# Patient Record
Sex: Male | Born: 1985 | Race: White | Hispanic: No | Marital: Single | Smoking: Never smoker
Health system: Southern US, Community
[De-identification: ages and names within clinical notes are randomized; demographics above are authoritative.]

## PROBLEM LIST (undated history)

## (undated) HISTORY — PX: WISDOM TOOTH EXTRACTION: SHX21

---
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
## 2012-03-05 ENCOUNTER — Emergency Department (HOSPITAL_COMMUNITY)
Admission: EM | Admit: 2012-03-05 | Discharge: 2012-03-05 | Disposition: A | Discharge status: Home / Self Care | Payer: Self-pay | Attending: Emergency Medicine | Admitting: Emergency Medicine

## 2012-03-05 ENCOUNTER — Emergency Department (HOSPITAL_COMMUNITY): Payer: Self-pay

## 2012-03-05 ENCOUNTER — Encounter (HOSPITAL_COMMUNITY): Payer: Self-pay | Admitting: Emergency Medicine

## 2012-03-05 DIAGNOSIS — M25579 Pain in unspecified ankle and joints of unspecified foot: Secondary | ICD-10-CM | POA: Insufficient documentation

## 2012-03-05 DIAGNOSIS — Y92009 Unspecified place in unspecified non-institutional (private) residence as the place of occurrence of the external cause: Secondary | ICD-10-CM | POA: Insufficient documentation

## 2012-03-05 DIAGNOSIS — R609 Edema, unspecified: Secondary | ICD-10-CM | POA: Insufficient documentation

## 2012-03-05 DIAGNOSIS — S82409A Unspecified fracture of shaft of unspecified fibula, initial encounter for closed fracture: Secondary | ICD-10-CM | POA: Insufficient documentation

## 2012-03-05 DIAGNOSIS — X58XXXA Exposure to other specified factors, initial encounter: Secondary | ICD-10-CM | POA: Insufficient documentation

## 2012-03-05 MED ORDER — HYDROCODONE-ACETAMINOPHEN 5-500 MG PO TABS: 1.0000 | ORAL_TABLET | Freq: Four times a day (QID) | ORAL | Status: AC | PRN

## 2012-03-05 MED ORDER — HYDROCODONE-ACETAMINOPHEN 5-500 MG PO TABS: 1.0000 | ORAL_TABLET | Freq: Four times a day (QID) | ORAL | Status: DC | PRN

## 2012-03-05 MED ORDER — IBUPROFEN 800 MG PO TABS
800.0000 mg | ORAL_TABLET | Freq: Once | ORAL | Status: AC
  Administered 2012-03-05: 800 mg via ORAL
  Filled 2012-03-05: qty 1

## 2012-03-05 MED ORDER — IBUPROFEN 800 MG PO TABS: 800.0000 mg | ORAL_TABLET | Freq: Three times a day (TID) | ORAL | Status: DC

## 2012-03-05 MED ORDER — IBUPROFEN 800 MG PO TABS: 800.0000 mg | ORAL_TABLET | Freq: Three times a day (TID) | ORAL | Status: AC

## 2012-03-05 MED ORDER — OXYCODONE-ACETAMINOPHEN 5-325 MG PO TABS
1.0000 | ORAL_TABLET | Freq: Once | ORAL | Status: AC
  Administered 2012-03-05: 1 via ORAL
  Filled 2012-03-05: qty 1

## 2012-03-05 NOTE — ED Notes (Signed)
Pt presenting to ed with c/o playing basketball and having right ankle pain with swelling.

## 2012-03-05 NOTE — ED Provider Notes (Signed)
History     CSN: [id number]  Arrival date & time 03/05/12  1519   None     No chief complaint on file.   (Consider location/radiation/quality/duration/timing/severity/associated sxs/prior treatment) Patient is a 26 y.o. male presenting with ankle pain. The history is provided by the patient. No language interpreter was used.  Ankle Pain  The incident occurred 1 to 2 hours ago. The incident occurred at home. The pain is present in the right ankle. The quality of the pain is described as aching and throbbing. The pain is at a severity of 8/10. The pain is moderate. The pain has been constant since onset. Associated symptoms include inability to bear weight. Pertinent negatives include no numbness, no loss of motion, no muscle weakness, no loss of sensation and no tingling. He reports no foreign bodies present. The symptoms are aggravated by activity. He has tried nothing for the symptoms.  Patient and mom reports playing basketball and getting a rebound and landed on his R ankle. Defomity noted.  + CMS to the ankle and foot and toes.  Will proceed with x-rays and pain meds.    No past medical history on file.  No past surgical history on file.  No family history on file.  History  Substance Use Topics  . Smoking status: Not on file  . Smokeless tobacco: Not on file  . Alcohol Use: Not on file      Review of Systems  Constitutional: Negative.   HENT: Negative.   Eyes: Negative.   Respiratory: Negative.   Cardiovascular: Negative.   Gastrointestinal: Negative.   Musculoskeletal:       R ankle pain  Neurological: Negative.  Negative for tingling and numbness.  Psychiatric/Behavioral: Negative.   All other systems reviewed and are negative.    Allergies  Review of patient's allergies indicates no known allergies.  Home Medications  No current outpatient prescriptions on file.  There were no vitals taken for this visit.  Physical Exam  Nursing note and vitals  reviewed. Constitutional: He is oriented to person, place, and time. He appears well-developed and well-nourished.  HENT:  Head: Normocephalic.  Eyes: Conjunctivae and EOM are normal. Pupils are equal, round, and reactive to light.  Neck: Normal range of motion. Neck supple.  Cardiovascular: Normal rate.   Pulmonary/Chest: Effort normal.  Abdominal: Soft.  Musculoskeletal: Normal range of motion. He exhibits edema and tenderness.       R ankle deformity tenderness and swelling  Neurological: He is alert and oriented to person, place, and time.  Skin: Skin is warm and dry.  Psychiatric: He has a normal mood and affect.    ED Course  Procedures (including critical care time)  Labs Reviewed - No data to display No results found.   No diagnosis found.    MDM  Posterior splint applied in ER for oblique fracture of the distal fibula.  Follow up with Dr. Montez Morita this week.  rx for percocet and ibuprofen. Crutches.        Remi Haggard, NP 03/06/12 1456

## 2012-03-05 NOTE — Discharge Instructions (Signed)
Henry Rojas you have fractured your distal fibula (the small bone in your leg).  We have put a posterior splint on the leg and given you crutches.  Follow up with Dr. Montez Morita the orthopedic doctor.  Call to make appointment to be seen within the next week.    Take ibuprofen for pain. Take the vicodin as needed for severe pain but do not drive with this medication.  Take these medications together.  Return to the ER for severe pain or other concerns.  Cast or Splint Care Casts and splints support injured limbs and keep bones from moving while they heal.  HOME CARE  Keep the cast or splint uncovered during the drying period.   A plaster cast can take 24 to 48 hours to dry.   A fiberglass cast will dry in less than 1 hour.   Do not rest the cast on anything harder than a pillow for 24 hours.   Do not put weight on your injured limb. Do not put pressure on the cast. Wait for your doctor's approval.   Keep the cast or splint dry.   Cover the cast or splint with a plastic bag during baths or wet weather.   If you have a cast over your chest and belly (trunk), take sponge baths until the cast is taken off.   Keep your cast or splint clean. Wash a dirty cast with a damp cloth.   Do not put any objects under your cast or splint. Do not scratch the skin under the cast with an object.   Do not take out the padding from inside your cast.   Exercise your joints near the cast as told by your doctor.   Raise (elevate) your injured limb on 1 or 2 pillows for the first 1 to 3 days.  GET HELP RIGHT AWAY IF:  Your cast or splint cracks.   Your cast or splint is too tight or too loose.   You itch badly under the cast.   Your cast gets wet or has a soft spot.   You have a bad smell coming from the cast.   You get an object stuck under the cast.   Your skin around the cast becomes red or raw.   You have new or more pain after the cast is put on.   You have fluid leaking through the cast.    You cannot move your fingers or toes.   Your fingers or toes turn colors or are cool, painful, or puffy (swollen).   You have tingling or lose feeling (numbness) around the injured area.   You have pain or pressure under the cast.   You have trouble breathing or have shortness of breath.   You have chest pain.  MAKE SURE YOU:  Understand these instructions.   Will watch your condition.   Will get help right away if you are not doing well or get worse.  Document Released: 01/24/2011 Document Revised: 09/13/2011 Document Reviewed: 01/24/2011 [facility] Patient Information 2012 [place], [place].Fibular Fracture, Ankle, Adult, Treated with or without Immobilization You have a fracture (break) of your fibula. This is the bone in your lower leg located on the outside of the leg. These fractures are easily diagnosed with x-rays. TREATMENT  You have a simple fracture of the part of the fibula which is located between the knee and ankle. This bone usually will heal without problems and can often be treated without casting or splinting. This means the fracture  will heal well during normal use and daily activities without being held in place. Sometimes a cast or splint is placed on these fractures if it is needed for comfort or if the bones are badly out of place. HOME CARE INSTRUCTIONS   Apply ice to the injury for 15 to 20 minutes, 3 to 4 times per day while awake, for 2 days. Put the ice in a plastic bag and place a thin towel between the bag of ice and your leg. This helps keep swelling down.   Use crutches as directed. Resume walking without crutches as directed by your caregiver or when comfortable doing so.   Only take over-the-counter or prescription medicines for pain, discomfort, or fever as directed by your caregiver.   Keep appointments for follow up x-rays if these are required.   If you have a removable splint or boot, do not remove the boot unless directed by your caregiver.    Warning: Do not drive a car or operate a motor vehicle until your caregiver specifically tells you it is safe to do so.  SEEK MEDICAL CARE IF:   You have continued severe pain or more swelling   The medications do not control the pain.   Your skin or nails below the injury turn blue or grey or feel cold or numb.   You develop severe pain in the leg or foot.  MAKE SURE YOU:   Understand these instructions.   Will watch your condition.   Will get help right away if you are not doing well or get worse.  Document Released: 09/24/2005 Document Revised: 09/13/2011 Document Reviewed: 04/30/2008 [facility] Patient Information 2012 [place], [place].

## 2012-03-05 NOTE — ED Notes (Signed)
Ortho tech called for splint.

## 2012-03-08 NOTE — ED Provider Notes (Signed)
Medical screening examination/treatment/procedure(s) were performed by non-physician practitioner and as supervising physician I was immediately available for consultation/collaboration.  Abrahan Fulmore T Disa Riedlinger, MD 03/08/12 2350 

## 2018-07-24 ENCOUNTER — Encounter (HOSPITAL_COMMUNITY): Payer: Self-pay | Admitting: Emergency Medicine

## 2018-07-24 ENCOUNTER — Emergency Department (HOSPITAL_COMMUNITY)
Admission: EM | Admit: 2018-07-24 | Discharge: 2018-07-24 | Disposition: A | Discharge status: Home / Self Care | Payer: Self-pay | Attending: Emergency Medicine | Admitting: Emergency Medicine

## 2018-07-24 ENCOUNTER — Emergency Department (HOSPITAL_COMMUNITY): Payer: Self-pay

## 2018-07-24 ENCOUNTER — Other Ambulatory Visit: Payer: Self-pay

## 2018-07-24 DIAGNOSIS — Z23 Encounter for immunization: Secondary | ICD-10-CM | POA: Insufficient documentation

## 2018-07-24 DIAGNOSIS — Y9389 Activity, other specified: Secondary | ICD-10-CM | POA: Insufficient documentation

## 2018-07-24 DIAGNOSIS — Y999 Unspecified external cause status: Secondary | ICD-10-CM | POA: Insufficient documentation

## 2018-07-24 DIAGNOSIS — W228XXA Striking against or struck by other objects, initial encounter: Secondary | ICD-10-CM | POA: Insufficient documentation

## 2018-07-24 DIAGNOSIS — S62317A Displaced fracture of base of fifth metacarpal bone. left hand, initial encounter for closed fracture: Secondary | ICD-10-CM | POA: Insufficient documentation

## 2018-07-24 DIAGNOSIS — Y929 Unspecified place or not applicable: Secondary | ICD-10-CM | POA: Insufficient documentation

## 2018-07-24 MED ORDER — KETOROLAC TROMETHAMINE 15 MG/ML IJ SOLN
15.0000 mg | Freq: Once | INTRAMUSCULAR | Status: DC
  Filled 2018-07-24: qty 1

## 2018-07-24 MED ORDER — OXYCODONE-ACETAMINOPHEN 5-325 MG PO TABS: 1.0000 | ORAL_TABLET | Freq: Three times a day (TID) | ORAL | 0 refills | Status: DC | PRN

## 2018-07-24 MED ORDER — BUPIVACAINE HCL 0.5 % IJ SOLN: 15.0000 mL | Freq: Once | INTRAMUSCULAR | Status: DC

## 2018-07-24 MED ORDER — TETANUS-DIPHTH-ACELL PERTUSSIS 5-2.5-18.5 LF-MCG/0.5 IM SUSP
0.5000 mL | Freq: Once | INTRAMUSCULAR | Status: AC
  Administered 2018-07-24: 0.5 mL via INTRAMUSCULAR
  Filled 2018-07-24: qty 0.5

## 2018-07-24 MED ORDER — BUPIVACAINE HCL (PF) 0.5 % IJ SOLN
15.0000 mL | Freq: Once | INTRAMUSCULAR | Status: AC
  Administered 2018-07-24: 15 mL
  Filled 2018-07-24: qty 30

## 2018-07-24 MED ORDER — LIDOCAINE HCL 2 % IJ SOLN
10.0000 mL | Freq: Once | INTRAMUSCULAR | Status: AC
  Administered 2018-07-24: 200 mg via INTRADERMAL
  Filled 2018-07-24: qty 20

## 2018-07-24 MED ORDER — OXYCODONE-ACETAMINOPHEN 5-325 MG PO TABS
1.0000 | ORAL_TABLET | Freq: Once | ORAL | Status: AC
  Administered 2018-07-24: 1 via ORAL
  Filled 2018-07-24: qty 1

## 2018-07-24 NOTE — Discharge Instructions (Addendum)
Please follow-up with Dr. Melvyn Novas in the orthopedic office.  You may take 500 mg of Tylenol every 6 hours for your pain.  Do not take more than 4000 g of Tylenol in a day.  Prescription given for Percocet. Take medication as directed and do not operate machinery, drive a car, or work while taking this medication as it can make you drowsy.  You may take this medication every 8 hours if you have breakthrough pain.  If you become constipated from taking Percocet you may take MiraLAX.  Return to the emergency department for any severe swelling, discoloration of your fingers, numbness to your fingers.
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
## 2018-07-29 ENCOUNTER — Encounter (INDEPENDENT_AMBULATORY_CARE_PROVIDER_SITE_OTHER): Payer: Self-pay | Admitting: Orthopaedic Surgery

## 2018-07-29 ENCOUNTER — Ambulatory Visit (INDEPENDENT_AMBULATORY_CARE_PROVIDER_SITE_OTHER): Payer: Self-pay | Admitting: Orthopaedic Surgery

## 2018-07-29 DIAGNOSIS — S62317A Displaced fracture of base of fifth metacarpal bone. left hand, initial encounter for closed fracture: Secondary | ICD-10-CM | POA: Insufficient documentation

## 2018-07-29 MED ORDER — OXYCODONE-ACETAMINOPHEN 5-325 MG PO TABS: ORAL_TABLET | ORAL | 0 refills | Status: DC

## 2018-07-29 NOTE — Addendum Note (Signed)
Addended byPrescott Parma on: 07/29/2018 04:40 PM   Modules accepted: Orders
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
## 2018-07-30 ENCOUNTER — Encounter (HOSPITAL_BASED_OUTPATIENT_CLINIC_OR_DEPARTMENT_OTHER): Payer: Self-pay | Admitting: *Deleted

## 2018-07-30 ENCOUNTER — Other Ambulatory Visit: Payer: Self-pay

## 2018-08-01 ENCOUNTER — Encounter (HOSPITAL_BASED_OUTPATIENT_CLINIC_OR_DEPARTMENT_OTHER)
Admission: RE | Disposition: A | Discharge status: Home / Self Care | Payer: Self-pay | Source: Ambulatory Visit | Attending: Orthopaedic Surgery

## 2018-08-01 ENCOUNTER — Ambulatory Visit (HOSPITAL_BASED_OUTPATIENT_CLINIC_OR_DEPARTMENT_OTHER): Payer: Self-pay | Admitting: Anesthesiology

## 2018-08-01 ENCOUNTER — Ambulatory Visit (HOSPITAL_COMMUNITY): Payer: Self-pay

## 2018-08-01 ENCOUNTER — Encounter (HOSPITAL_BASED_OUTPATIENT_CLINIC_OR_DEPARTMENT_OTHER): Payer: Self-pay

## 2018-08-01 ENCOUNTER — Ambulatory Visit (HOSPITAL_BASED_OUTPATIENT_CLINIC_OR_DEPARTMENT_OTHER)
Admission: RE | Admit: 2018-08-01 | Discharge: 2018-08-01 | Disposition: A | Discharge status: Home / Self Care | Payer: Self-pay | Source: Ambulatory Visit | Attending: Orthopaedic Surgery | Admitting: Orthopaedic Surgery

## 2018-08-01 ENCOUNTER — Other Ambulatory Visit: Payer: Self-pay

## 2018-08-01 DIAGNOSIS — S62327A Displaced fracture of shaft of fifth metacarpal bone, left hand, initial encounter for closed fracture: Secondary | ICD-10-CM | POA: Insufficient documentation

## 2018-08-01 DIAGNOSIS — W2209XA Striking against other stationary object, initial encounter: Secondary | ICD-10-CM | POA: Insufficient documentation

## 2018-08-01 DIAGNOSIS — Y92008 Other place in unspecified non-institutional (private) residence as the place of occurrence of the external cause: Secondary | ICD-10-CM | POA: Insufficient documentation

## 2018-08-01 DIAGNOSIS — S62317A Displaced fracture of base of fifth metacarpal bone. left hand, initial encounter for closed fracture: Secondary | ICD-10-CM | POA: Diagnosis present

## 2018-08-01 DIAGNOSIS — Z419 Encounter for procedure for purposes other than remedying health state, unspecified: Secondary | ICD-10-CM

## 2018-08-01 HISTORY — PX: CLOSED REDUCTION METACARPAL WITH PERCUTANEOUS PINNING: SHX5613

## 2018-08-01 SURGERY — CLOSED REDUCTION, FRACTURE, METACARPAL BONE, WITH PERCUTANEOUS PINNING
Anesthesia: General | Site: Hand | Laterality: Left

## 2018-08-01 MED ORDER — PROPOFOL 10 MG/ML IV BOLUS
INTRAVENOUS | Status: DC | PRN
  Administered 2018-08-01: 50 mg via INTRAVENOUS
  Administered 2018-08-01: 200 mg via INTRAVENOUS

## 2018-08-01 MED ORDER — MIDAZOLAM HCL 2 MG/2ML IJ SOLN
1.0000 mg | INTRAMUSCULAR | Status: DC | PRN
  Administered 2018-08-01: 2 mg via INTRAVENOUS

## 2018-08-01 MED ORDER — MIDAZOLAM HCL 2 MG/2ML IJ SOLN
INTRAMUSCULAR | Status: AC
  Filled 2018-08-01: qty 2

## 2018-08-01 MED ORDER — OXYCODONE HCL 5 MG PO TABS
5.0000 mg | ORAL_TABLET | Freq: Once | ORAL | Status: AC | PRN
  Administered 2018-08-01: 5 mg via ORAL

## 2018-08-01 MED ORDER — OXYCODONE HCL 5 MG/5ML PO SOLN: 5.0000 mg | Freq: Once | ORAL | Status: AC | PRN

## 2018-08-01 MED ORDER — ACETAMINOPHEN 325 MG PO TABS: 325.0000 mg | ORAL_TABLET | ORAL | Status: DC | PRN

## 2018-08-01 MED ORDER — FENTANYL CITRATE (PF) 100 MCG/2ML IJ SOLN
25.0000 ug | INTRAMUSCULAR | Status: DC | PRN
  Administered 2018-08-01 (×2): 50 ug via INTRAVENOUS

## 2018-08-01 MED ORDER — DEXAMETHASONE SODIUM PHOSPHATE 4 MG/ML IJ SOLN
INTRAMUSCULAR | Status: DC | PRN
  Administered 2018-08-01: 10 mg via INTRAVENOUS

## 2018-08-01 MED ORDER — CHLORHEXIDINE GLUCONATE 4 % EX LIQD: 60.0000 mL | Freq: Once | CUTANEOUS | Status: DC

## 2018-08-01 MED ORDER — SCOPOLAMINE 1 MG/3DAYS TD PT72: 1.0000 | MEDICATED_PATCH | Freq: Once | TRANSDERMAL | Status: DC | PRN

## 2018-08-01 MED ORDER — CEFAZOLIN SODIUM-DEXTROSE 2-4 GM/100ML-% IV SOLN
INTRAVENOUS | Status: AC
  Filled 2018-08-01: qty 100

## 2018-08-01 MED ORDER — FENTANYL CITRATE (PF) 100 MCG/2ML IJ SOLN
50.0000 ug | INTRAMUSCULAR | Status: AC | PRN
  Administered 2018-08-01: 50 ug via INTRAVENOUS
  Administered 2018-08-01: 100 ug via INTRAVENOUS
  Administered 2018-08-01: 50 ug via INTRAVENOUS

## 2018-08-01 MED ORDER — OXYCODONE HCL 5 MG PO TABS
ORAL_TABLET | ORAL | Status: AC
  Filled 2018-08-01: qty 1

## 2018-08-01 MED ORDER — FENTANYL CITRATE (PF) 100 MCG/2ML IJ SOLN
INTRAMUSCULAR | Status: AC
  Filled 2018-08-01: qty 2

## 2018-08-01 MED ORDER — ACETAMINOPHEN 160 MG/5ML PO SOLN: 325.0000 mg | ORAL | Status: DC | PRN

## 2018-08-01 MED ORDER — ONDANSETRON HCL 4 MG/2ML IJ SOLN
INTRAMUSCULAR | Status: DC | PRN
  Administered 2018-08-01: 4 mg via INTRAVENOUS

## 2018-08-01 MED ORDER — ONDANSETRON HCL 4 MG/2ML IJ SOLN
INTRAMUSCULAR | Status: AC
  Filled 2018-08-01: qty 2

## 2018-08-01 MED ORDER — BUPIVACAINE HCL (PF) 0.25 % IJ SOLN
INTRAMUSCULAR | Status: DC | PRN
  Administered 2018-08-01: 10 mL

## 2018-08-01 MED ORDER — LIDOCAINE 2% (20 MG/ML) 5 ML SYRINGE
INTRAMUSCULAR | Status: AC
  Filled 2018-08-01: qty 5

## 2018-08-01 MED ORDER — CEFAZOLIN SODIUM-DEXTROSE 2-4 GM/100ML-% IV SOLN
2.0000 g | INTRAVENOUS | Status: AC
  Administered 2018-08-01: 2 g via INTRAVENOUS

## 2018-08-01 MED ORDER — LIDOCAINE HCL (CARDIAC) PF 100 MG/5ML IV SOSY
PREFILLED_SYRINGE | INTRAVENOUS | Status: DC | PRN
  Administered 2018-08-01: 100 mg via INTRAVENOUS

## 2018-08-01 MED ORDER — DEXAMETHASONE SODIUM PHOSPHATE 10 MG/ML IJ SOLN
INTRAMUSCULAR | Status: AC
  Filled 2018-08-01: qty 1

## 2018-08-01 MED ORDER — PROPOFOL 500 MG/50ML IV EMUL
INTRAVENOUS | Status: AC
  Filled 2018-08-01: qty 50

## 2018-08-01 MED ORDER — ONDANSETRON HCL 4 MG/2ML IJ SOLN: 4.0000 mg | Freq: Once | INTRAMUSCULAR | Status: DC | PRN

## 2018-08-01 MED ORDER — OXYCODONE-ACETAMINOPHEN 5-325 MG PO TABS: 1.0000 | ORAL_TABLET | Freq: Three times a day (TID) | ORAL | 0 refills | Status: DC | PRN

## 2018-08-01 MED ORDER — LACTATED RINGERS IV SOLN
INTRAVENOUS | Status: DC
  Administered 2018-08-01: 13:00:00 via INTRAVENOUS

## 2018-08-01 MED ORDER — LACTATED RINGERS IV SOLN
INTRAVENOUS | Status: DC
  Administered 2018-08-01: 12:00:00 via INTRAVENOUS

## 2018-08-01 MED ORDER — ONDANSETRON HCL 4 MG PO TABS: 4.0000 mg | ORAL_TABLET | Freq: Three times a day (TID) | ORAL | 0 refills | Status: DC | PRN

## 2018-08-01 MED ORDER — MEPERIDINE HCL 25 MG/ML IJ SOLN: 6.2500 mg | INTRAMUSCULAR | Status: DC | PRN

## 2018-08-01 SURGICAL SUPPLY — 60 items
BANDAGE ACE 3X5.8 VEL STRL LF (GAUZE/BANDAGES/DRESSINGS) ×4 IMPLANT
BLADE MINI RND TIP GREEN BEAV (BLADE) IMPLANT
BLADE SURG 15 STRL LF DISP TIS (BLADE) ×2 IMPLANT
BLADE SURG 15 STRL SS (BLADE) ×4
BNDG CMPR 9X4 STRL LF SNTH (GAUZE/BANDAGES/DRESSINGS) ×2
BNDG ESMARK 4X9 LF (GAUZE/BANDAGES/DRESSINGS) ×4 IMPLANT
BNDG PLASTER X FAST 3X3 WHT LF (CAST SUPPLIES) ×4 IMPLANT
BNDG PLSTR 9X3 FST ST WHT (CAST SUPPLIES) ×1
BRUSH SCRUB EZ PLAIN DRY (MISCELLANEOUS) ×4 IMPLANT
CANISTER SUCTION 1200CC (MISCELLANEOUS) ×4 IMPLANT
CORD BIPOLAR FORCEPS 12FT (ELECTRODE) IMPLANT
COVER BACK TABLE 60X90IN (DRAPES) ×4 IMPLANT
COVER MAYO STAND STRL (DRAPES) ×4 IMPLANT
COVER WAND RF STERILE (DRAPES) IMPLANT
CUFF TOURNIQUET SINGLE 18IN (TOURNIQUET CUFF) ×4 IMPLANT
DECANTER SPIKE VIAL GLASS SM (MISCELLANEOUS) IMPLANT
DRAPE C-ARM 42X72 X-RAY (DRAPES) ×4 IMPLANT
DRAPE EXTREMITY T 121X128X90 (DRAPE) ×4 IMPLANT
DRAPE OEC MINIVIEW 54X84 (DRAPES) ×4 IMPLANT
DRAPE SURG 17X23 STRL (DRAPES) ×4 IMPLANT
GAUZE SPONGE 4X4 12PLY STRL (GAUZE/BANDAGES/DRESSINGS) ×4 IMPLANT
GAUZE XEROFORM 1X8 LF (GAUZE/BANDAGES/DRESSINGS) ×4 IMPLANT
GLOVE BIOGEL PI IND STRL 7.0 (GLOVE) IMPLANT
GLOVE BIOGEL PI IND STRL 8 (GLOVE) ×4 IMPLANT
GLOVE BIOGEL PI INDICATOR 7.0 (GLOVE)
GLOVE BIOGEL PI INDICATOR 8 (GLOVE) ×4
GLOVE ECLIPSE 7.0 STRL STRAW (GLOVE) IMPLANT
GLOVE SKINSENSE NS SZ7.5 (GLOVE) ×2
GLOVE SKINSENSE STRL SZ7.5 (GLOVE) ×2 IMPLANT
GLOVE SURG SS PI 7.5 STRL IVOR (GLOVE) ×4 IMPLANT
GLOVE SURG SYN 7.5  E (GLOVE) ×2
GLOVE SURG SYN 7.5 E (GLOVE) ×2 IMPLANT
GOWN STRL REIN XL XLG (GOWN DISPOSABLE) ×8 IMPLANT
GOWN STRL REUS W/ TWL LRG LVL3 (GOWN DISPOSABLE) IMPLANT
GOWN STRL REUS W/ TWL XL LVL3 (GOWN DISPOSABLE) IMPLANT
GOWN STRL REUS W/TWL LRG LVL3 (GOWN DISPOSABLE)
GOWN STRL REUS W/TWL XL LVL3 (GOWN DISPOSABLE)
GUIDEWIRE ORTH 6X062XTROC NS (WIRE) ×2 IMPLANT
K-WIRE .035X4 (WIRE) ×8 IMPLANT
K-WIRE .062 (WIRE) ×3
NEEDLE HYPO 22GX1.5 SAFETY (NEEDLE) IMPLANT
NS IRRIG 1000ML POUR BTL (IV SOLUTION) ×4 IMPLANT
PACK BASIN DAY SURGERY FS (CUSTOM PROCEDURE TRAY) ×4 IMPLANT
PAD CAST 3X4 CTTN HI CHSV (CAST SUPPLIES) ×2 IMPLANT
PADDING CAST COTTON 3X4 STRL (CAST SUPPLIES) ×4
RUBBERBAND STERILE (MISCELLANEOUS) IMPLANT
SLEEVE SCD COMPRESS KNEE MED (MISCELLANEOUS) IMPLANT
SPLINT FIBERGLASS 3X35 (CAST SUPPLIES) ×4 IMPLANT
STOCKINETTE 4X48 STRL (DRAPES) ×4 IMPLANT
SUCTION FRAZIER HANDLE 10FR (MISCELLANEOUS)
SUCTION TUBE FRAZIER 10FR DISP (MISCELLANEOUS) IMPLANT
SUT ETHILON 4 0 PS 2 18 (SUTURE) IMPLANT
SYR BULB 3OZ (MISCELLANEOUS) ×4 IMPLANT
SYR CONTROL 10ML LL (SYRINGE) IMPLANT
TOWEL GREEN STERILE FF (TOWEL DISPOSABLE) ×4 IMPLANT
TOWEL OR NON WOVEN STRL DISP B (DISPOSABLE) ×4 IMPLANT
TRAY DSU PREP LF (CUSTOM PROCEDURE TRAY) ×4 IMPLANT
TUBE CONNECTING 20'X1/4 (TUBING)
TUBE CONNECTING 20X1/4 (TUBING) IMPLANT
UNDERPAD 30X30 (UNDERPADS AND DIAPERS) ×4 IMPLANT

## 2018-08-01 NOTE — Discharge Instructions (Signed)
Postoperative instructions: ° °Weightbearing instructions: non weight bearing ° °Dressing instructions: Keep your dressing and/or splint clean and dry at all times.  It will be removed at your first post-operative appointment.  Your stitches and/or staples will be removed at this visit. ° °Incision instructions:  Do not soak your incision for 3 weeks after surgery.  If the incision gets wet, pat dry and do not scrub the incision. ° °Pain control:  You have been given a prescription to be taken as directed for post-operative pain control.  In addition, elevate the operative extremity above the heart at all times to prevent swelling and throbbing pain. ° °Take over-the-counter Colace, 100mg by mouth twice a day while taking narcotic pain medications to help prevent constipation. ° °Follow up appointments: °1) 12-14 days for suture removal and wound check. °2) Dr. Xu as scheduled. ° ° ------------------------------------------------------------------------------------------------------------- ° °After Surgery Pain Control: ° °After your surgery, post-surgical discomfort or pain is likely. This discomfort can last several days to a few weeks. At certain times of the day your discomfort may be more intense.  °Did you receive a nerve block?  °A nerve block can provide pain relief for one hour to two days after your surgery. As long as the nerve block is working, you will experience little or no sensation in the area the surgeon operated on.  °As the nerve block wears off, you will begin to experience pain or discomfort. It is very important that you begin taking your prescribed pain medication before the nerve block fully wears off. Treating your pain at the first sign of the block wearing off will ensure your pain is better controlled and more tolerable when full-sensation returns. Do not wait until the pain is intolerable, as the medicine will be less effective. It is better to treat pain in advance than to try and  catch up.  °General Anesthesia:  °If you did not receive a nerve block during your surgery, you will need to start taking your pain medication shortly after your surgery and should continue to do so as prescribed by your surgeon.  °Pain Medication:  °Most commonly we prescribe Vicodin and Percocet for post-operative pain. Both of these medications contain a combination of acetaminophen (Tylenol®) and a narcotic to help control pain.  °· It takes between 30 and 45 minutes before pain medication starts to work. It is important to take your medication before your pain level gets too intense.  °· Nausea is a common side effect of many pain medications. You will want to eat something before taking your pain medicine to help prevent nausea.  °· If you are taking a prescription pain medication that contains acetaminophen, we recommend that you do not take additional over the counter acetaminophen (Tylenol®).  °Other pain relieving options:  °· Using a cold pack to ice the affected area a few times a day (15 to 20 minutes at a time) can help to relieve pain, reduce swelling and bruising.  °· Elevation of the affected area can also help to reduce pain and swelling. ° ° ° ° ° ° °Post Anesthesia Home Care Instructions ° °Activity: °Get plenty of rest for the remainder of the day. A responsible individual must stay with you for 24 hours following the procedure.  °For the next 24 hours, DO NOT: °-Drive a car °-Operate machinery °-Drink alcoholic beverages °-Take any medication unless instructed by your physician °-Make any legal decisions or sign important papers. ° °Meals: °Start with liquid foods such   as gelatin or soup. Progress to regular foods as tolerated. Avoid greasy, spicy, heavy foods. If nausea and/or vomiting occur, drink only clear liquids until the nausea and/or vomiting subsides. Call your physician if vomiting continues. ° °Special Instructions/Symptoms: °Your throat may feel dry or sore from the anesthesia or  the breathing tube placed in your throat during surgery. If this causes discomfort, gargle with warm salt water. The discomfort should disappear within 24 hours. ° °If you had a scopolamine patch placed behind your ear for the management of post- operative nausea and/or vomiting: ° °1. The medication in the patch is effective for 72 hours, after which it should be removed.  Wrap patch in a tissue and discard in the trash. Wash hands thoroughly with soap and water. °2. You may remove the patch earlier than 72 hours if you experience unpleasant side effects which may include dry mouth, dizziness or visual disturbances. °3. Avoid touching the patch. Wash your hands with soap and water after contact with the patch. °   ° °

## 2018-08-01 NOTE — Anesthesia Preprocedure Evaluation (Addendum)

## 2018-08-01 NOTE — H&P (Signed)
    PREOPERATIVE H&P  Chief Complaint: left 5th metacarpal fracture  HPI: Henry Rojas is a 32 y.o. male who presents for surgical treatment of left 5th metacarpal fracture.  He denies any changes in medical history.  History reviewed. No pertinent past medical history. Past Surgical History:  Procedure Laterality Date  . WISDOM TOOTH EXTRACTION     Social History   Socioeconomic History  . Marital status: Single    Spouse name: Not on file  . Number of children: Not on file  . Years of education: Not on file  . Highest education level: Not on file  Occupational History  . Not on file  Social Needs  . Financial resource strain: Not on file  . Food insecurity:    Worry: Not on file    Inability: Not on file  . Transportation needs:    Medical: Not on file    Non-medical: Not on file  Tobacco Use  . Smoking status: Never Smoker  . Smokeless tobacco: Never Used  Substance and Sexual Activity  . Alcohol use: Yes    Comment: socially  . Drug use: Yes    Types: Marijuana, Cocaine, Other-see comments    Comment:  marijuana 07/30/2018, Cocaine - 1 month ago  . Sexual activity: Yes  Lifestyle  . Physical activity:    Days per week: Not on file    Minutes per session: Not on file  . Stress: Not on file  Relationships  . Social connections:    Talks on phone: Not on file    Gets together: Not on file    Attends religious service: Not on file    Active member of club or organization: Not on file    Attends meetings of clubs or organizations: Not on file    Relationship status: Not on file  Other Topics Concern  . Not on file  Social History Narrative  . Not on file   Family History  Problem Relation Age of Onset  . Healthy Mother   . Diabetes Father   . Kidney failure Father    No Known Allergies Prior to Admission medications   Medication Sig Start Date End Date Taking? Authorizing Provider  oxyCODONE-acetaminophen (PERCOCET/ROXICET) 5-325 MG tablet Take 1  tablet by mouth every 8 (eight) hours as needed for severe pain. 07/24/18  Yes Couture, Cortni S, PA-C  oxyCODONE-acetaminophen (PERCOCET/ROXICET) 5-325 MG tablet 1 po bid prn pain 07/29/18   Tarry Kos, MD     Positive ROS: All other systems have been reviewed and were otherwise negative with the exception of those mentioned in the HPI and as above.  Physical Exam: General: Alert, no acute distress Cardiovascular: No pedal edema Respiratory: No cyanosis, no use of accessory musculature GI: abdomen soft Skin: No lesions in the area of chief complaint Neurologic: Sensation intact distally Psychiatric: Patient is competent for consent with normal mood and affect Lymphatic: no lymphedema  MUSCULOSKELETAL: exam stable  Assessment: left 5th metacarpal fracture  Plan: Plan for Procedure(s): OPEN REDUCTION INTERNAL FIXATION (ORIF)  VS. CLOSED REDUCTION PERCUTANEOUS PINNING LEFT 5TH METACARPAL FRACTURE  The risks benefits and alternatives were discussed with the patient including but not limited to the risks of nonoperative treatment, versus surgical intervention including infection, bleeding, nerve injury,  blood clots, cardiopulmonary complications, morbidity, mortality, among others, and they were willing to proceed.   Glee Arvin, MD   08/01/2018 11:52 AM

## 2018-08-01 NOTE — Transfer of Care (Signed)
Immediate Anesthesia Transfer of Care Note  Patient: Henry Rojas  Procedure(s) Performed: OPEN REDUCTION INTERNAL FIXATION (ORIF)  VS. CLOSED REDUCTION PERCUTANEOUS PINNING LEFT 5TH METACARPAL FRACTURE (Left )  Patient Location: PACU  Anesthesia Type:General  Level of Consciousness: awake and oriented  Airway & Oxygen Therapy: Patient Spontanous Breathing  Post-op Assessment: Report given to RN and Post -op Vital signs reviewed and stable  Post vital signs: Reviewed and stable  Last Vitals:  Vitals Value Taken Time  BP    Temp    Pulse 96 08/01/2018  1:56 PM  Resp 20 08/01/2018  1:56 PM  SpO2 100 % 08/01/2018  1:56 PM  Vitals shown include unvalidated device data.  Last Pain:  Vitals:   08/01/18 1126  PainSc: 4       Patients Stated Pain Goal: 2 (08/01/18 1126)  Complications: No apparent anesthesia complications

## 2018-08-01 NOTE — Anesthesia Procedure Notes (Signed)
Procedure Name: LMA Insertion Performed by: Gar Gibbon, CRNA Pre-anesthesia Checklist: Patient identified, Emergency Drugs available, Suction available and Patient being monitored Patient Re-evaluated:Patient Re-evaluated prior to induction Oxygen Delivery Method: Circle system utilized Preoxygenation: Pre-oxygenation with 100% oxygen Induction Type: IV induction Ventilation: Mask ventilation without difficulty LMA: LMA inserted LMA Size: 4.0 Number of attempts: 1 Airway Equipment and Method: Bite block Placement Confirmation: positive ETCO2 Tube secured with: Tape Dental Injury: Teeth and Oropharynx as per pre-operative assessment

## 2018-08-01 NOTE — Op Note (Signed)
   Date of Surgery: 08/01/2018  INDICATIONS: Henry Rojas is a 32 y.o.-year-old male with a left displaced fifth metacarpal fracture;  The patient did consent to the procedure after discussion of the risks and benefits.  PREOPERATIVE DIAGNOSIS: Displaced left fifth metacarpal shaft fracture  POSTOPERATIVE DIAGNOSIS: Same.  PROCEDURE: Closed reduction and percutaneous pinning of 5th metacarpal fracture  SURGEON: N. Glee Arvin, M.D.  ASSIST: none  ANESTHESIA:  general  IV FLUIDS AND URINE: See anesthesia.  ESTIMATED BLOOD LOSS: minimal mL.  IMPLANTS: 0.062 K wire, 0.035 K wire  DRAINS: none  COMPLICATIONS: None.  DESCRIPTION OF PROCEDURE: The patient was brought to the operating room and placed supine on the operating table.  The patient had been signed prior to the procedure and this was documented. The patient had the anesthesia placed by the anesthesiologist.  A time-out was performed to confirm that this was the correct patient, site, side and location. The patient did receive antibiotics prior to the incision and was re-dosed during the procedure as needed at indicated intervals.  A tourniquet was placed.  The patient had the operative extremity prepped and draped in the standard surgical fashion.    Closed reduction of the metacarpal fracture was performed with palpable crepitus of the fracture site.  Reduction was confirmed under fluoroscopy.  While reduced a 0.062 K wire was advanced in a retrograde direction down the canal of the metacarpal starting in the metacarpal head.  The placement was confirmed with fluoro on orthogonal views.  I then placed a 0.035 K wire from the 5th metacarpal into the 4th metacarpal for rotational stability.  This was also confirmed under fluoro.  Final xrays were taken.  The small finger was in correct alignment and rotation.  The pins were bent and cut short.  Sterile dressings and a ulnar gutter splint was applied.  Patient tolerated the procedure well  and had no immediate complications.  POSTOPERATIVE PLAN: discharge home and follow up in 2 weeks for splint removal and xrays  N. Glee Arvin, MD [facility] [phone] 1:49 PM

## 2018-08-05 ENCOUNTER — Encounter (HOSPITAL_BASED_OUTPATIENT_CLINIC_OR_DEPARTMENT_OTHER): Payer: Self-pay | Admitting: Orthopaedic Surgery

## 2018-08-05 NOTE — Anesthesia Postprocedure Evaluation (Signed)
Anesthesia Post Note  Patient: Henry Rojas  Procedure(s) Performed: CLOSED REDUCTION PERCUTANEOUS PINNING LEFT 5TH METACARPAL FRACTURE (Left Hand)     Patient location during evaluation: PACU Anesthesia Type: General Level of consciousness: awake and alert Pain management: pain level controlled Vital Signs Assessment: post-procedure vital signs reviewed and stable Respiratory status: spontaneous breathing, nonlabored ventilation, respiratory function stable and patient connected to nasal cannula oxygen Cardiovascular status: blood pressure returned to baseline and stable Postop Assessment: no apparent nausea or vomiting Anesthetic complications: no    Last Vitals:  Vitals:   08/01/18 1430 08/01/18 1443  BP: (!) 140/97 (!) 135/99  Pulse: 82 89  Resp: 16 16  Temp:  37.2 C  SpO2: 98% 98%    Last Pain:  Vitals:   08/01/18 1443  PainSc: 5                  Henry Rojas

## 2018-08-08 ENCOUNTER — Encounter (HOSPITAL_BASED_OUTPATIENT_CLINIC_OR_DEPARTMENT_OTHER): Payer: Self-pay | Admitting: Orthopaedic Surgery

## 2018-08-12 ENCOUNTER — Ambulatory Visit (INDEPENDENT_AMBULATORY_CARE_PROVIDER_SITE_OTHER): Payer: Self-pay

## 2018-08-12 ENCOUNTER — Ambulatory Visit (INDEPENDENT_AMBULATORY_CARE_PROVIDER_SITE_OTHER): Payer: Self-pay | Admitting: Orthopaedic Surgery

## 2018-08-12 DIAGNOSIS — S62317A Displaced fracture of base of fifth metacarpal bone. left hand, initial encounter for closed fracture: Secondary | ICD-10-CM

## 2018-08-12 MED ORDER — OXYCODONE-ACETAMINOPHEN 5-325 MG PO TABS: 1.0000 | ORAL_TABLET | Freq: Every evening | ORAL | 0 refills | Status: DC | PRN

## 2018-08-12 NOTE — Progress Notes (Signed)
   Post-Op Visit Note   Patient: Henry Rojas           Date of Birth: July 31, 1986           MRN: [id number] Visit Date: 08/12/2018 PCP: Patient, No Pcp Per   Assessment & Plan:  Chief Complaint:  Chief Complaint  Patient presents with  . Left Hand - Routine Post Op   Visit Diagnoses:  1. Closed displaced fracture of base of fifth metacarpal bone of left hand, initial encounter     Plan: Jeannett Senior is two-week status post percutaneous pinning of a displaced fifth metacarpal fracture.  He is overall doing well.  Reports no significant pain.  Pain is mainly at night after he gets off of work.  Today we remove the splint and the pin sites are clean dry and intact.  We reapplied a ulnar gutter cast.  He may return to work with limited use of his left hand.  Follow-up in 2 weeks with three-view x-rays of the left hand out of the cast.  Follow-Up Instructions: Return in about 2 weeks (around 08/26/2018).   Orders:  Orders Placed This Encounter  Procedures  . XR Hand Complete Left   Meds ordered this encounter  Medications  . oxyCODONE-acetaminophen (PERCOCET) 5-325 MG tablet    Sig: Take 1-2 tablets by mouth at bedtime as needed for severe pain.    Dispense:  20 tablet    Refill:  0    Imaging: Xr Hand Complete Left  Result Date: 08/12/2018 Stable fixation of fifth metacarpal fracture.  No hardware complication.   PMFS History: Patient Active Problem List   Diagnosis Date Noted  . Closed displaced fracture of base of fifth metacarpal bone of left hand 07/29/2018   No past medical history on file.  Family History  Problem Relation Age of Onset  . Healthy Mother   . Diabetes Father   . Kidney failure Father     Past Surgical History:  Procedure Laterality Date  . CLOSED REDUCTION METACARPAL WITH PERCUTANEOUS PINNING Left 08/01/2018   Procedure: CLOSED REDUCTION PERCUTANEOUS PINNING LEFT 5TH METACARPAL FRACTURE;  Surgeon: Tarry Kos, MD;  Location: [facility];  Service: Orthopedics;  Laterality: Left;  . WISDOM TOOTH EXTRACTION     Social History   Occupational History  . Not on file  Tobacco Use  . Smoking status: Never Smoker  . Smokeless tobacco: Never Used  Substance and Sexual Activity  . Alcohol use: Yes    Comment: socially  . Drug use: Yes    Types: Marijuana, Cocaine, Other-see comments    Comment:  marijuana 07/30/2018, Cocaine - 1 month ago  . Sexual activity: Yes

## 2018-08-27 ENCOUNTER — Ambulatory Visit (INDEPENDENT_AMBULATORY_CARE_PROVIDER_SITE_OTHER): Payer: Self-pay | Admitting: Physician Assistant

## 2018-08-27 ENCOUNTER — Encounter (INDEPENDENT_AMBULATORY_CARE_PROVIDER_SITE_OTHER): Payer: Self-pay | Admitting: Orthopaedic Surgery

## 2018-08-27 ENCOUNTER — Ambulatory Visit (INDEPENDENT_AMBULATORY_CARE_PROVIDER_SITE_OTHER): Payer: Self-pay

## 2018-08-27 DIAGNOSIS — S62317A Displaced fracture of base of fifth metacarpal bone. left hand, initial encounter for closed fracture: Secondary | ICD-10-CM

## 2018-08-27 NOTE — Progress Notes (Signed)
   Post-Op Visit Note   Patient: Henry StandardSteven K Swopes           Date of Birth: [birth year]           MRN: [id number] Visit Date: 08/27/2018 PCP: Patient, No Pcp Per   Assessment & Plan:  Chief Complaint:  Chief Complaint  Patient presents with  . Left Hand - Pain, Follow-up   Visit Diagnoses:  1. Closed displaced fracture of base of fifth metacarpal bone of left hand, initial encounter     Plan: Patient is a pleasant 32 year old gentleman who presents to our clinic today 26 days status post closed reduction percutaneous pinning left fifth metacarpal fracture, date of surgery 08/01/2018.  He has been doing well.  He has been compliant in a ulnar gutter cast.  Elevating for swelling.  Minimal pain.  Examination of his left hand reveals clean pin sites without evidence of cellulitis or infection.  He is neurovascularly intact distally.  No swelling.  Today, both pins were pulled.  Band-Aids applied.  We did place the patient in a removable ulnar gutter splint.  We will start him in hand therapy and a prescription was given today to the patient for this.  Still no lifting left upper extremity.  Follow-up with us in 4 weeks time for repeat evaluation and 3 view x-rays of left hand.  Follow-Up Instructions: Return in about 4 weeks (around 09/24/2018).   Orders:  Orders Placed This Encounter  Procedures  . XR Hand Complete Left   No orders of the defined types were placed in this encounter.   Imaging: Xr Hand Complete Left  Result Date: 08/27/2018 Stable alignment of the fracture with slight callus formation   PMFS History: Patient Active Problem List   Diagnosis Date Noted  . Closed displaced fracture of base of fifth metacarpal bone of left hand 07/29/2018   History reviewed. No pertinent past medical history.  Family History  Problem Relation Age of Onset  . Healthy Mother   . Diabetes Father   . Kidney failure Father     Past Surgical History:  Procedure Laterality Date  .  CLOSED REDUCTION METACARPAL WITH PERCUTANEOUS PINNING Left 08/01/2018   Procedure: CLOSED REDUCTION PERCUTANEOUS PINNING LEFT 5TH METACARPAL FRACTURE;  Surgeon: Tarry KosXu, Naiping M, MD;  Location: [facility];  Service: Orthopedics;  Laterality: Left;  . WISDOM TOOTH EXTRACTION     Social History   Occupational History  . Not on file  Tobacco Use  . Smoking status: Never Smoker  . Smokeless tobacco: Never Used  Substance and Sexual Activity  . Alcohol use: Yes    Comment: socially  . Drug use: Yes    Types: Marijuana, Cocaine, Other-see comments    Comment:  marijuana 07/30/2018, Cocaine - 1 month ago  . Sexual activity: Yes

## 2020-03-20 ENCOUNTER — Encounter (HOSPITAL_BASED_OUTPATIENT_CLINIC_OR_DEPARTMENT_OTHER): Payer: Self-pay

## 2020-03-20 ENCOUNTER — Emergency Department (HOSPITAL_BASED_OUTPATIENT_CLINIC_OR_DEPARTMENT_OTHER)
Admission: EM | Admit: 2020-03-20 | Discharge: 2020-03-20 | Disposition: A | Discharge status: Home / Self Care | Payer: Self-pay | Attending: Emergency Medicine | Admitting: Emergency Medicine

## 2020-03-20 ENCOUNTER — Other Ambulatory Visit: Payer: Self-pay

## 2020-03-20 ENCOUNTER — Emergency Department (HOSPITAL_BASED_OUTPATIENT_CLINIC_OR_DEPARTMENT_OTHER): Payer: Self-pay

## 2020-03-20 DIAGNOSIS — Y939 Activity, unspecified: Secondary | ICD-10-CM | POA: Insufficient documentation

## 2020-03-20 DIAGNOSIS — S0990XA Unspecified injury of head, initial encounter: Secondary | ICD-10-CM

## 2020-03-20 DIAGNOSIS — Y999 Unspecified external cause status: Secondary | ICD-10-CM | POA: Insufficient documentation

## 2020-03-20 DIAGNOSIS — S0001XA Abrasion of scalp, initial encounter: Secondary | ICD-10-CM | POA: Insufficient documentation

## 2020-03-20 DIAGNOSIS — Y92410 Unspecified street and highway as the place of occurrence of the external cause: Secondary | ICD-10-CM | POA: Insufficient documentation

## 2020-03-20 DIAGNOSIS — S52121A Displaced fracture of head of right radius, initial encounter for closed fracture: Secondary | ICD-10-CM | POA: Insufficient documentation

## 2020-03-20 MED ORDER — OXYCODONE HCL 5 MG PO TABS
5.0000 mg | ORAL_TABLET | Freq: Once | ORAL | Status: AC
  Administered 2020-03-20: 5 mg via ORAL
  Filled 2020-03-20: qty 1

## 2020-03-20 MED ORDER — IBUPROFEN 600 MG PO TABS: 600.0000 mg | ORAL_TABLET | Freq: Four times a day (QID) | ORAL | 0 refills | Status: AC | PRN

## 2020-03-20 NOTE — Discharge Instructions (Signed)
Please read and follow all provided instructions.  Your diagnoses today include:  1. Closed displaced fracture of head of right radius, initial encounter   2. Minor head injury, initial encounter   3. Abrasion of scalp, initial encounter     Tests performed today include:  X-ray of your elbow which showed a minimally displaced radial head fracture  Vital signs. See below for your results today.   Medications prescribed:   Ibuprofen (Motrin, Advil) - anti-inflammatory pain medication  Do not exceed 600mg  ibuprofen every 6 hours, take with food  You have been prescribed an anti-inflammatory medication or NSAID. Take with food. Take smallest effective dose for the shortest duration needed for your pain. Stop taking if you experience stomach pain or vomiting.   Take any prescribed medications only as directed.  Home care instructions:  Follow any educational materials contained in this packet.  BE VERY CAREFUL not to take multiple medicines containing Tylenol (also called acetaminophen). Doing so can lead to an overdose which can damage your liver and cause liver failure and possibly death.   Follow-up instructions: Please follow-up with Dr. of orthopedics in the next 1 week for reevaluation of your elbow.  Return instructions:  SEEK IMMEDIATE MEDICAL ATTENTION IF:  There is confusion or drowsiness (although children frequently become drowsy after injury).   You cannot awaken the injured person.   You have more than one episode of vomiting.   You notice dizziness or unsteadiness which is getting worse, or inability to walk.   You have convulsions or unconsciousness.   You experience severe, persistent headaches not relieved by Tylenol.  You cannot use arms or legs normally.   There are changes in pupil sizes. (This is the black center in the colored part of the eye)   There is clear or bloody discharge from the nose or ears.   You have change in speech, vision,  swallowing, or understanding.   Localized weakness, numbness, tingling, or change in bowel or bladder control.  You have any other emergent concerns.  Additional Information: You have had a head injury which does not appear to require admission at this time.  Your vital signs today were: BP 121/80 (BP Location: Right Arm)   Pulse 81   Temp 98.5 F (36.9 C) (Oral)   Resp 18   Ht 5\' 9"  (1.753 m)   Wt 95.3 kg   SpO2 98%   BMI 31.01 kg/m  If your blood pressure (BP) was elevated above 135/85 this visit, please have this repeated by your doctor within one month. --------------
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
